# Patient Record
Sex: Male | Born: 1970 | Race: Black or African American | Hispanic: No | Marital: Married
Health system: Southern US, Community
[De-identification: ages and names within clinical notes are randomized; demographics above are authoritative.]

---

## 2019-03-13 DIAGNOSIS — K047 Periapical abscess without sinus: Secondary | ICD-10-CM | POA: Diagnosis not present

## 2019-03-13 DIAGNOSIS — K0889 Other specified disorders of teeth and supporting structures: Secondary | ICD-10-CM | POA: Diagnosis present

## 2019-03-14 ENCOUNTER — Other Ambulatory Visit: Payer: Self-pay

## 2019-03-14 ENCOUNTER — Emergency Department (HOSPITAL_COMMUNITY)
Admission: EM | Admit: 2019-03-14 | Discharge: 2019-03-14 | Disposition: A | Discharge status: Home / Self Care | Payer: Medicare Other | Attending: Emergency Medicine | Admitting: Emergency Medicine

## 2019-03-14 ENCOUNTER — Encounter (HOSPITAL_COMMUNITY): Payer: Self-pay

## 2019-03-14 DIAGNOSIS — K047 Periapical abscess without sinus: Secondary | ICD-10-CM

## 2019-03-14 MED ORDER — PENICILLIN V POTASSIUM 500 MG PO TABS
500.0000 mg | ORAL_TABLET | Freq: Once | ORAL | Status: AC
  Administered 2019-03-14: 500 mg via ORAL
  Filled 2019-03-14: qty 1

## 2019-03-14 MED ORDER — PENICILLIN V POTASSIUM 500 MG PO TABS: 500.0000 mg | ORAL_TABLET | Freq: Four times a day (QID) | ORAL | 0 refills | Status: AC

## 2019-03-14 MED ORDER — HYDROCODONE-ACETAMINOPHEN 5-325 MG PO TABS
1.0000 | ORAL_TABLET | Freq: Once | ORAL | Status: AC
  Administered 2019-03-14: 1 via ORAL
  Filled 2019-03-14: qty 1

## 2019-03-14 MED ORDER — NAPROXEN 375 MG PO TABS: 375.0000 mg | ORAL_TABLET | Freq: Two times a day (BID) | ORAL | 0 refills | Status: AC

## 2019-03-14 NOTE — ED Provider Notes (Signed)
[facility]-EMERGENCY DEPT Provider Note   CSN: [id number] Arrival date & time: 03/13/19  2355     History   Chief Complaint Chief Complaint  Patient presents with  . Dental Pain    HPI Allen Moore is a 48 y.o. male.     Patient to ED with complaint of dental pain and facial swelling x 2 days. He reports bloody, foul taking draining earlier today from the area. No fever, difficulty swallowing. He states he is scheduled to have multiple extractions for known severe decay pending his return to [place] in [place].   The history is provided by the patient. No language interpreter was used.  Dental Pain Associated symptoms: facial swelling   Associated symptoms: no fever     History reviewed. No pertinent past medical history.  There are no active problems to display for this patient.   History reviewed. No pertinent surgical history.      Home Medications    Prior to Admission medications   Not on File    Family History History reviewed. No pertinent family history.  Social History Social History   Tobacco Use  . Smoking status: Not on file  Substance Use Topics  . Alcohol use: Not on file  . Drug use: Not on file     Allergies   Patient has no known allergies.   Review of Systems Review of Systems  Constitutional: Negative for fever.  HENT: Positive for dental problem and facial swelling. Negative for sore throat and trouble swallowing.   Respiratory: Negative for shortness of breath.   Gastrointestinal: Negative for nausea.     Physical Exam Updated Vital Signs BP (!) 163/112 (BP Location: Left Arm)   Pulse (!) 103   Temp 98.9 F (37.2 C) (Oral)   Resp 16   SpO2 98%   Physical Exam Constitutional:      Appearance: He is well-developed.  HENT:     Mouth/Throat:     Mouth: Mucous membranes are moist.     Pharynx: Oropharynx is clear.     Comments: Severe dental disease and decay, most notably of the upper  incisors. There is a small hard palette swelling adjacent to #8 c/w abscess. Mild associated facial swelling. Neck:     Musculoskeletal: Normal range of motion.  Pulmonary:     Effort: Pulmonary effort is normal.  Musculoskeletal: Normal range of motion.  Skin:    General: Skin is warm and dry.  Neurological:     Mental Status: He is alert and oriented to person, place, and time.      ED Treatments / Results  Labs (all labs ordered are listed, but only abnormal results are displayed) Labs Reviewed - No data to display  EKG None  Radiology No results found.  Procedures Procedures (including critical care time)  Medications Ordered in ED Medications  penicillin v potassium (VEETID) tablet 500 mg (has no administration in time range)  HYDROcodone-acetaminophen (NORCO/VICODIN) 5-325 MG per tablet 1 tablet (has no administration in time range)     Initial Impression / Assessment and Plan / ED Course  I have reviewed the triage vital signs and the nursing notes.  Pertinent labs & imaging results that were available during my care of the patient were reviewed by me and considered in my medical decision making (see chart for details).        Patient to ED with concern for dental infection associated with known severe decay, acute facial swelling and drainage  from area today.   Findings on exam c/w dental infection. Will start on PCN. He has established follow up in [place] when he returns home.   Final Clinical Impressions(s) / ED Diagnoses   Final diagnoses:  None   1. Dental abscess   ED Discharge Orders    None       Charlann Lange, PA-C 03/14/19 0057    Veryl Speak, MD 03/14/19 [phone]

## 2019-03-14 NOTE — Discharge Instructions (Signed)
Keep your scheduled follow up with your dentist when you return home to [place]. Take medications as prescribed.

## 2019-03-14 NOTE — ED Triage Notes (Signed)
Pt reports an abscess on a R upper tooth. He states that it popped and drained yesterday. Pain started on Wednesday. Pt denies fever or virus symptoms, but states that he went [place] last week.

## 2019-07-12 ENCOUNTER — Emergency Department (HOSPITAL_COMMUNITY)
Admission: EM | Admit: 2019-07-12 | Discharge: 2019-07-12 | Disposition: A | Discharge status: Home / Self Care | Payer: Medicare Other | Attending: Emergency Medicine | Admitting: Emergency Medicine

## 2019-07-12 ENCOUNTER — Other Ambulatory Visit: Payer: Self-pay

## 2019-07-12 DIAGNOSIS — K047 Periapical abscess without sinus: Secondary | ICD-10-CM | POA: Insufficient documentation

## 2019-07-12 DIAGNOSIS — K029 Dental caries, unspecified: Secondary | ICD-10-CM | POA: Diagnosis not present

## 2019-07-12 DIAGNOSIS — K0889 Other specified disorders of teeth and supporting structures: Secondary | ICD-10-CM | POA: Diagnosis present

## 2019-07-12 MED ORDER — PENICILLIN V POTASSIUM 500 MG PO TABS: 1000.0000 mg | ORAL_TABLET | Freq: Two times a day (BID) | ORAL | 0 refills | Status: AC

## 2019-07-12 NOTE — ED Triage Notes (Signed)
Pt arrives POV from home c/o abscess on gums that ruptured. Pt reports pus and abscess being malodorous. Pt states dental appointment is November 23rd.

## 2019-07-12 NOTE — ED Provider Notes (Signed)
[facility] EMERGENCY DEPARTMENT Provider Note   CSN: [id number] Arrival date & time: 07/12/19  1004     History   Chief Complaint Chief Complaint  Patient presents with  . Dental Problem    HPI Allen Moore is a 48 y.o. male who presents the emergency department with chief complaint of dental pain.  He has past medical history of severe dental decay and recurrent abscesses.  Patient states that this morning he noticed the dental abscess on his right lower gumline which "popped."  Patient states that he swallowed some of the material and his wife told him he needed to come here and get checked out.  He denies any difficulty swallowing, shortness of breath, fevers or chills.       HPI  No past medical history on file.  There are no active problems to display for this patient.   No past surgical history on file.      Home Medications    Prior to Admission medications   Medication Sig Start Date End Date Taking? Authorizing Provider  naproxen (NAPROSYN) 375 MG tablet Take 1 tablet (375 mg total) by mouth 2 (two) times daily. 03/14/19   Elpidio Anis, PA-C  penicillin v potassium (VEETID) 500 MG tablet Take 2 tablets (1,000 mg total) by mouth 2 (two) times daily. X 7 days 07/12/19   Arthor Captain, PA-C    Family History No family history on file.  Social History Social History   Tobacco Use  . Smoking status: Not on file  Substance Use Topics  . Alcohol use: Not on file  . Drug use: Not on file     Allergies   Patient has no known allergies.   Review of Systems Review of Systems Ten systems reviewed and are negative for acute change, except as noted in the HPI.    Physical Exam Updated Vital Signs BP (!) 173/106 (BP Location: Right Arm)   Pulse 91   Temp 98.7 F (37.1 C) (Oral)   Resp 18   SpO2 99%   Physical Exam Vitals signs and nursing note reviewed.  Constitutional:      General: He is not in acute distress.    Appearance:  He is well-developed. He is not diaphoretic.  HENT:     Head: Normocephalic and atraumatic.     Mouth/Throat:     Comments: Severe dental decay, erythema along the gumline.  Palpable induration along the right masseter which is well demarcated and localized.  No extension into the throat, uvula midline, normal phonation. Eyes:     General: No scleral icterus.    Conjunctiva/sclera: Conjunctivae normal.  Neck:     Musculoskeletal: Normal range of motion and neck supple.  Cardiovascular:     Rate and Rhythm: Normal rate and regular rhythm.     Heart sounds: Normal heart sounds.  Pulmonary:     Effort: Pulmonary effort is normal. No respiratory distress.     Breath sounds: Normal breath sounds.  Abdominal:     Palpations: Abdomen is soft.     Tenderness: There is no abdominal tenderness.  Skin:    General: Skin is warm and dry.  Neurological:     Mental Status: He is alert.  Psychiatric:        Behavior: Behavior normal.      ED Treatments / Results  Labs (all labs ordered are listed, but only abnormal results are displayed) Labs Reviewed - No data to display  EKG None  Radiology No results found.  Procedures Procedures (including critical care time)  Medications Ordered in ED Medications - No data to display   Initial Impression / Assessment and Plan / ED Course  I have reviewed the triage vital signs and the nursing notes.  Pertinent labs & imaging results that were available during my care of the patient were reviewed by me and considered in my medical decision making (see chart for details).        Patient with Dental infection. No evidence of Ludwig's angina. D/c with pcn  And OP follow up. Discussed return precautions.   Final Clinical Impressions(s) / ED Diagnoses   Final diagnoses:  Dental abscess    ED Discharge Orders         Ordered    penicillin v potassium (VEETID) 500 MG tablet  2 times daily     07/12/19 1041           Margarita Mail, PA-C 07/12/19 1047    Mabe, Forbes Cellar, MD 07/12/19 1110

## 2019-07-12 NOTE — ED Notes (Signed)
Patient verbalizes understanding of discharge instructions. Opportunity for questioning and answers were provided. Armband removed by staff, pt discharged from ED.  

## 2019-07-12 NOTE — Discharge Instructions (Addendum)

## 2020-02-11 ENCOUNTER — Other Ambulatory Visit: Payer: Self-pay

## 2020-02-11 ENCOUNTER — Emergency Department (HOSPITAL_COMMUNITY)
Admission: EM | Admit: 2020-02-11 | Discharge: 2020-02-11 | Disposition: A | Discharge status: Home / Self Care | Payer: Medicare Other | Attending: Emergency Medicine | Admitting: Emergency Medicine

## 2020-02-11 ENCOUNTER — Emergency Department (HOSPITAL_COMMUNITY): Payer: Medicare Other

## 2020-02-11 ENCOUNTER — Encounter (HOSPITAL_COMMUNITY): Payer: Self-pay | Admitting: Emergency Medicine

## 2020-02-11 DIAGNOSIS — M545 Low back pain, unspecified: Secondary | ICD-10-CM

## 2020-02-11 MED ORDER — IBUPROFEN 800 MG PO TABS
800.0000 mg | ORAL_TABLET | Freq: Once | ORAL | Status: AC
  Administered 2020-02-11: 800 mg via ORAL
  Filled 2020-02-11: qty 1

## 2020-02-11 MED ORDER — IBUPROFEN 800 MG PO TABS: 800.0000 mg | ORAL_TABLET | Freq: Three times a day (TID) | ORAL | 0 refills | Status: AC | PRN

## 2020-02-11 NOTE — ED Triage Notes (Signed)
Pt reports that Saturday he was painting a house and when lifted a can of paint hurt lower back. Hx back surgery

## 2020-02-11 NOTE — Discharge Instructions (Signed)
Per our discussion, I am prescribing you ibuprofen for your continued pain.  Try to take this medication with a small amount of food to prevent stomach irritation.  You have also been given orthopedic referral.  Please feel free to reach out to them.  I would recommend following up with your primary care provider regarding your symptoms as well as this visit.  If your symptoms worsen you can return to the emergency department.  It was a pleasure to meet you.

## 2020-02-11 NOTE — ED Provider Notes (Signed)
[facility] DEPT Provider Note   CSN: [id number] Arrival date & time: 02/11/20  1203     History Chief Complaint  Patient presents with  . Back Pain    Allen Moore is a 49 y.o. male.  HPI Patient presents today with acute low back pain.  Patient reports a history of surgery to the lumbar spine in 2010.  [place]  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  For the last 4 to 5 days he states he has been experiencing 10/10 pain.  His pain worsens with any movement as well as with any heavy lifting.  He has been taking Aleve with little relief.  He denies numbness, tingling, weakness, bowel or bladder incontinence, chest pain, shortness of breath.    History reviewed. No pertinent past medical history.  There are no problems to display for this patient.   History reviewed. No pertinent surgical history.     No family history on file.  Social History   Tobacco Use  . Smoking status: Not on file  Substance Use Topics  . Alcohol use: Not on file  . Drug use: Not on file    Home Medications Prior to Admission medications   Medication Sig Start Date End Date Taking? Authorizing Provider  naproxen (NAPROSYN) 375 MG tablet Take 1 tablet (375 mg total) by mouth 2 (two) times daily. 03/14/19   Charlann Lange, PA-C  penicillin v potassium (VEETID) 500 MG tablet Take 2 tablets (1,000 mg total) by mouth 2 (two) times daily. X 7 days 07/12/19   Margarita Mail, PA-C    Allergies    Patient has no known allergies.  Review of Systems   Review of Systems  Constitutional: Negative for chills and fever.  Respiratory: Negative for shortness of breath.   Cardiovascular: Negative for chest pain.  Genitourinary: Negative for difficulty urinating and frequency.  Musculoskeletal: Positive for back pain.  Skin: Negative for color change and wound.  Neurological:  Negative for weakness and numbness.   Physical Exam Updated Vital Signs BP (!) 146/96 (BP Location: Left Arm)   Pulse 75   Temp 98.5 F (36.9 C) (Oral)   Resp 17   SpO2 99%   Physical Exam Vitals and nursing note reviewed.  Constitutional:      General: He is not in acute distress.    Appearance: Normal appearance. He is not ill-appearing, toxic-appearing or diaphoretic.  HENT:     Head: Normocephalic and atraumatic.     Right Ear: External ear normal.     Left Ear: External ear normal.     Nose: Nose normal.     Mouth/Throat:     Pharynx: Oropharynx is clear.  Eyes:     Extraocular Movements: Extraocular movements intact.  Neck:     Comments: No midline cervical or thoracic spinal tenderness. Cardiovascular:     Rate and Rhythm: Normal rate and regular rhythm.     Pulses: Normal pulses.     Heart sounds: Normal heart sounds. No murmur. No friction rub. No gallop.   Pulmonary:     Effort: Pulmonary effort is normal. No respiratory distress.     Breath sounds: Normal breath sounds. No stridor. No wheezing, rhonchi or rales.  Abdominal:     General: Abdomen is flat.     Tenderness: There is no abdominal tenderness.  Musculoskeletal:  General: Normal range of motion.     Cervical back: Normal range of motion and neck supple. No tenderness.     Comments: Well-healed surgical scar noted over the lumbar spine.  Mild TTP noted overlying and surrounding the site.  No tenderness appreciated over the lumbar paraspinal musculature.  Skin:    General: Skin is warm and dry.  Neurological:     General: No focal deficit present.     Mental Status: He is alert and oriented to person, place, and time.     Comments: Palpable pedal pulses noted bilaterally.  Distal sensation intact in the bilateral lower extremities.  Strength is 5 out of 5 in the bilateral lower extremities.  Patient able to ambulate with a steady gait.  Psychiatric:        Mood and Affect: Mood normal.         Behavior: Behavior normal.    ED Results / Procedures / Treatments   Labs (all labs ordered are listed, but only abnormal results are displayed) Labs Reviewed - No data to display  EKG None  Radiology DG Lumbar Spine Complete  Result Date: 02/11/2020 CLINICAL DATA:  Midline lumbar pain, history of surgery. Reports pain onset after lifting a paint can. EXAM: LUMBAR SPINE - COMPLETE 4+ VIEW COMPARISON:  None. FINDINGS: The alignment is maintained. Vertebral body heights are normal. There is no listhesis. The posterior elements are intact. Mild endplate spurring at L3-L4 with preservation of disc spaces. No fracture. Sacroiliac joints are symmetric and normal. IMPRESSION: Mild endplate spurring at L3-L4. Otherwise negative radiographs of the lumbar spine. Electronically Signed   By: Narda Rutherford M.D.   On: 02/11/2020 16:01    Procedures Procedures (including critical care time)  Medications Ordered in ED Medications  ibuprofen (ADVIL) tablet 800 mg (has no administration in time range)    ED Course  I have reviewed the triage vital signs and the nursing notes.  Pertinent labs & imaging results that were available during my care of the patient were reviewed by me and considered in my medical decision making (see chart for details).    MDM Rules/Calculators/A&P                      Patient is a 49 year old male with signs and symptoms noted above.  Physical exam is extremely reassuring.  No neurological deficits appreciated.  No signs and symptoms consistent with cauda equina or sciatica.  I obtained an x-ray of the lumbar spine due to his surgical history to the region.  Mild endplate spurring was noted at L3-L4 but otherwise negative radiographs.  Discussed this with the patient.  He is going to follow-up with his primary care provider.  I additionally gave him orthopedic referral.  He was prescribed 800 mg ibuprofen for continued management of his back pain.  We discussed  safety regarding this medication.  He understands he can return to the emergency department for reevaluation.  His questions were answered and he was amicable at the time of discharge.  His vital signs are stable.  Patient discharged to home/self care.  Condition at discharge: Stable  Note: Portions of this report may have been transcribed using voice recognition software. Every effort was made to ensure accuracy; however, inadvertent computerized transcription errors may be present.    Final Clinical Impression(s) / ED Diagnoses Final diagnoses:  Acute midline low back pain without sciatica    Rx / DC Orders ED Discharge Orders  Ordered    ibuprofen (ADVIL) 800 MG tablet  Every 8 hours PRN     02/11/20 1616           Placido Sou, PA-C 02/11/20 1621    Terald Sleeper, MD 02/11/20 1756

## 2021-06-06 IMAGING — CR DG LUMBAR SPINE COMPLETE 4+V
5 series · 5 of 5 positions shown · non-contrast
Comparison: None.

CLINICAL DATA: Midline lumbar pain, history of surgery. Reports
pain onset after lifting a paint can.

EXAM:
LUMBAR SPINE - COMPLETE 4+ VIEW

[t lumbar spine ap]
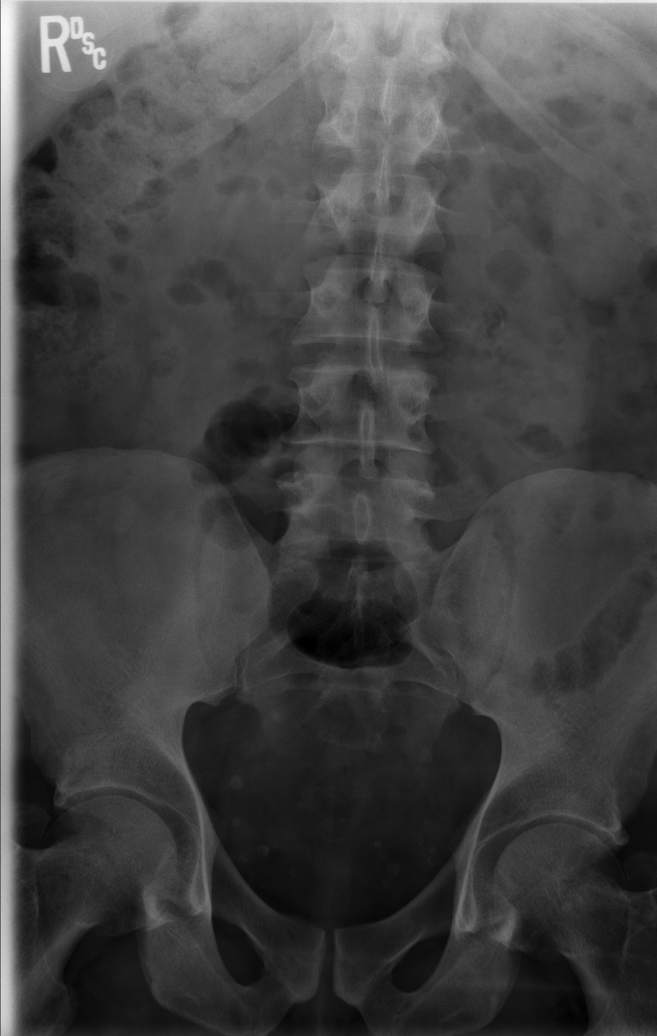

[t lumbar spine obl (1 of 2)]
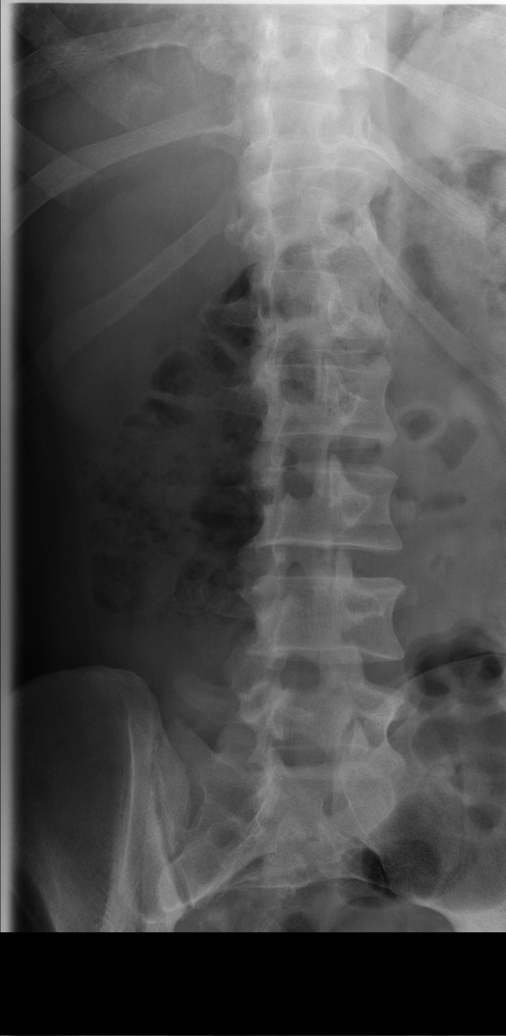

[t lumbar spine lat]
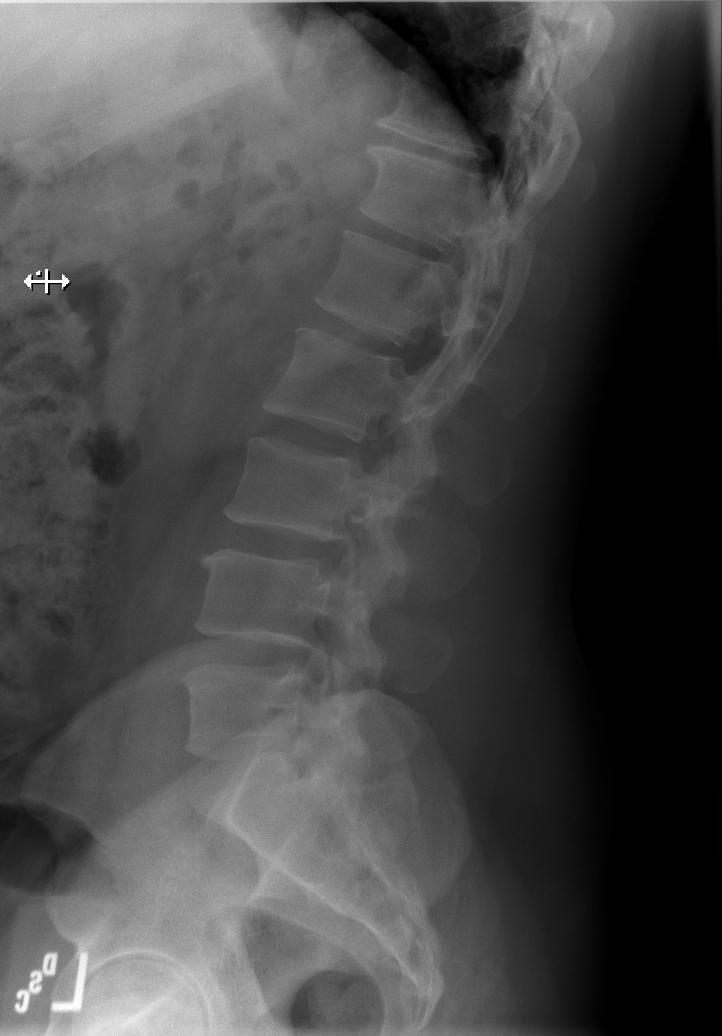

[t lumbar l-5 s-1 spot]
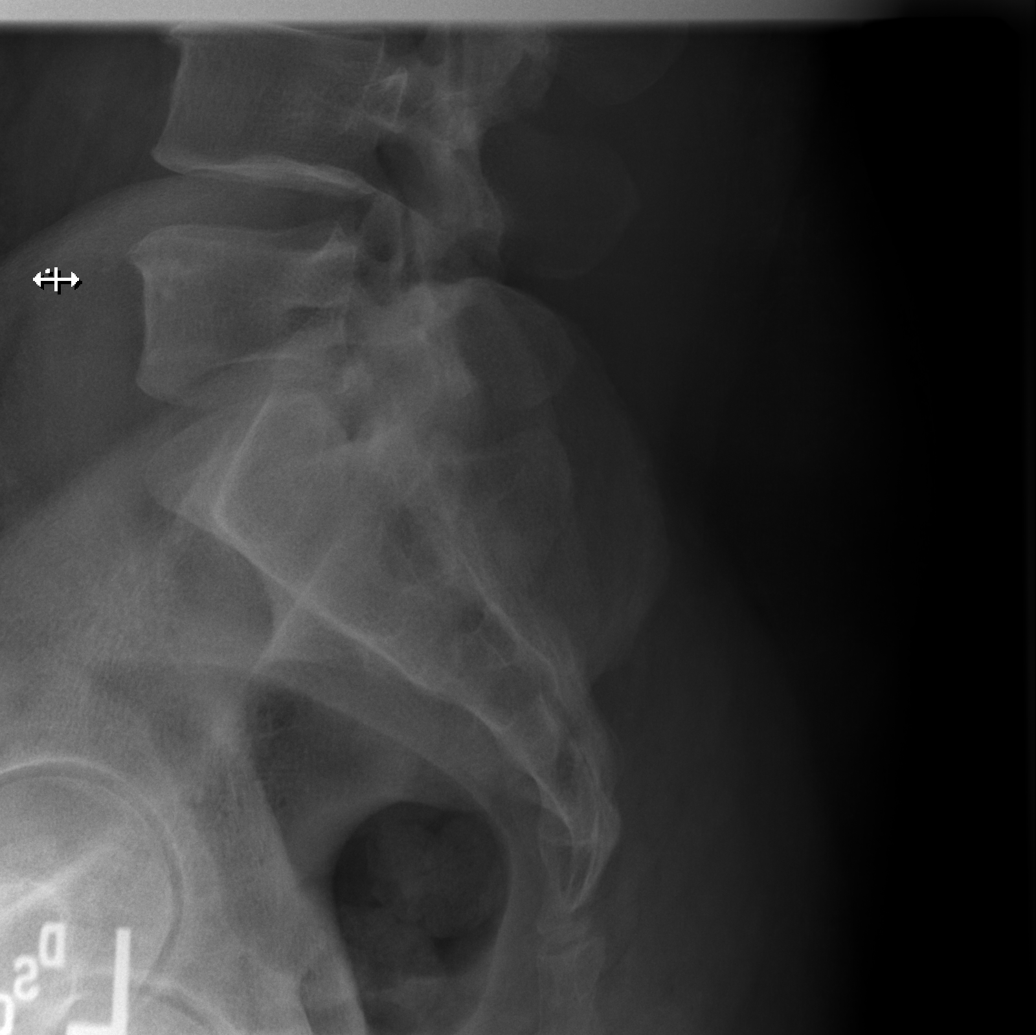

[t lumbar spine obl (2 of 2)]
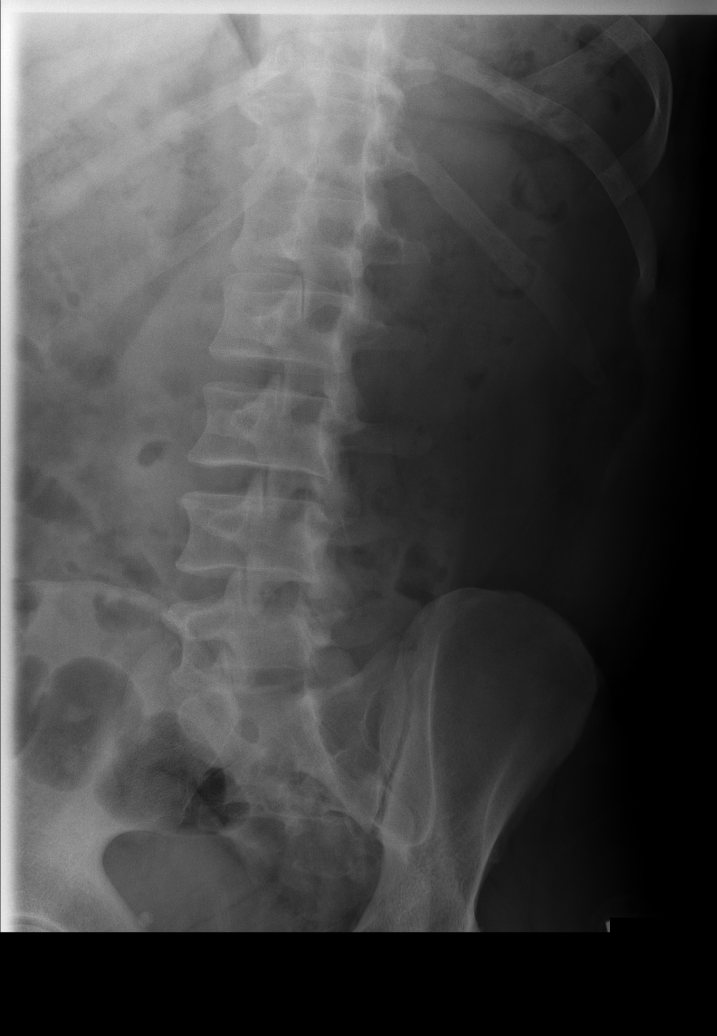

[5 of 5 positions shown; findings below may reference images not displayed]

FINDINGS: The alignment is maintained. Vertebral body heights are normal.
There is no listhesis. The posterior elements are intact. Mild
endplate spurring at L3-L4 with preservation of disc spaces. No
fracture. Sacroiliac joints are symmetric and normal.
IMPRESSION: Mild endplate spurring at L3-L4. Otherwise negative radiographs of
the lumbar spine.
